# Patient Record
Sex: Male | Born: 1965 | Race: Black or African American | Hispanic: No | Smoking: Never smoker
Health system: Southern US, Community
[De-identification: ages and names within clinical notes are randomized; demographics above are authoritative.]

## PROBLEM LIST (undated history)

## (undated) DIAGNOSIS — G4733 Obstructive sleep apnea (adult) (pediatric): Secondary | ICD-10-CM

## (undated) DIAGNOSIS — I10 Essential (primary) hypertension: Secondary | ICD-10-CM

## (undated) HISTORY — PX: KNEE ARTHROSCOPY: SUR90

## (undated) HISTORY — DX: Obstructive sleep apnea (adult) (pediatric): G47.33

## (undated) HISTORY — PX: KELOID EXCISION: SHX1856

## (undated) HISTORY — DX: Essential (primary) hypertension: I10

---

## 2008-12-31 ENCOUNTER — Emergency Department (HOSPITAL_COMMUNITY): Admission: EM | Admit: 2008-12-31 | Discharge: 2008-12-31 | Payer: Self-pay | Admitting: Emergency Medicine

## 2010-06-28 IMAGING — CT CT HEAD W/O CM
1 of 2 series · 16 of 30 positions shown, 20 images · non-contrast
Comparison: None

CLINICAL DATA: Seizure

CT HEAD WITHOUT CONTRAST
TECHNIQUE: Contiguous axial images were obtained from the base of
the skull through the vertex without contrast.

[Series 3: recon 2: brain · axial · 0.47mm/px · z∈[+159,+287]mm · 16 of 56 slices shown, 20 images]
[im 3/56  brain]
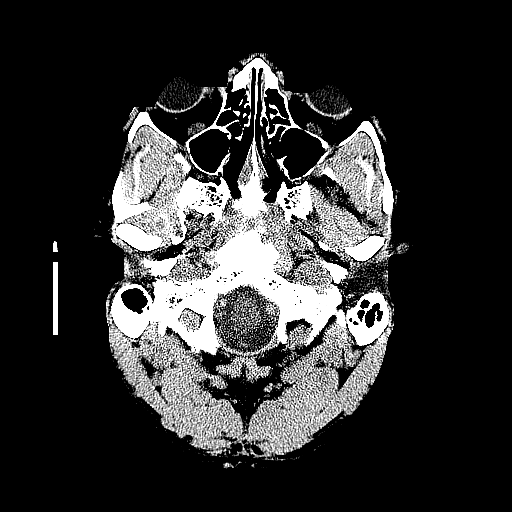
[im 3/56  bone]
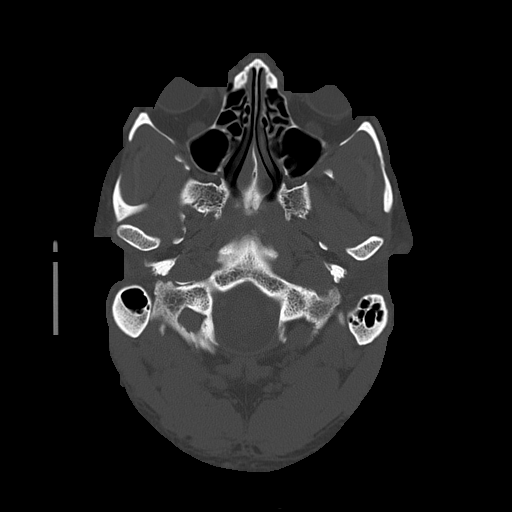
[im 6/56  brain]
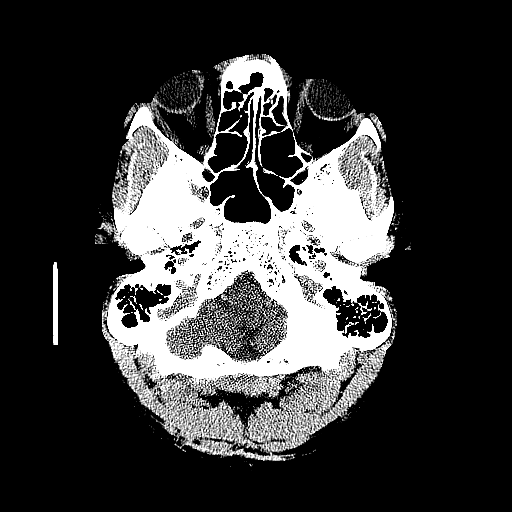
[im 9/56  brain]
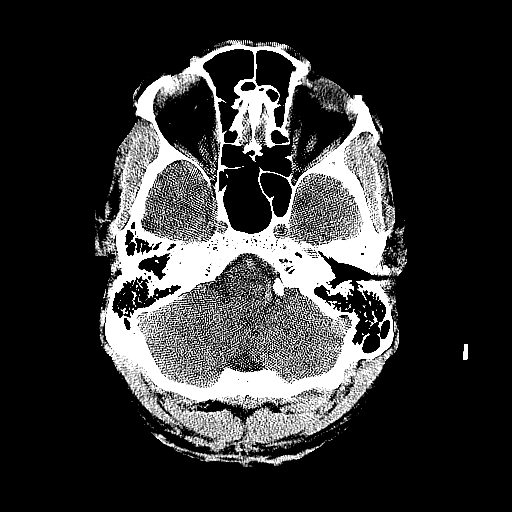
[im 12/56  brain]
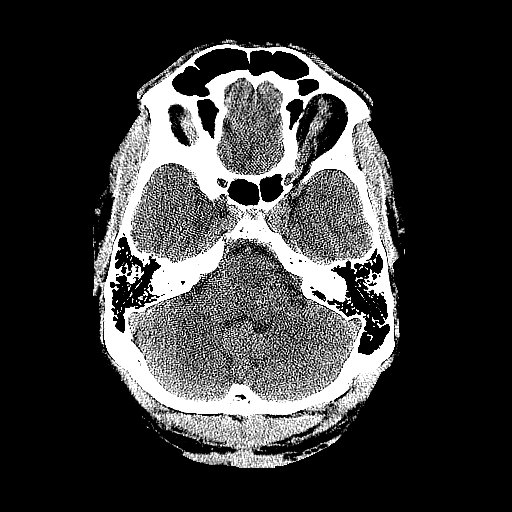
[im 18/56  brain]
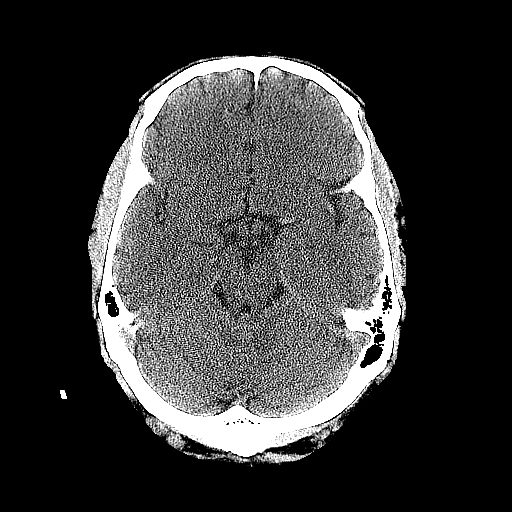
[im 18/56  bone]
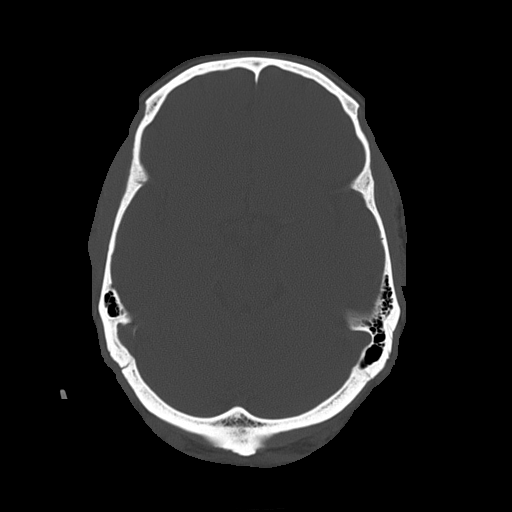
[im 21/56  brain]
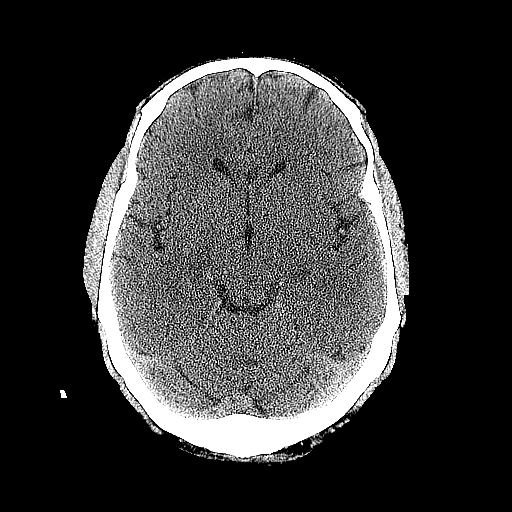
[im 24/56  brain]
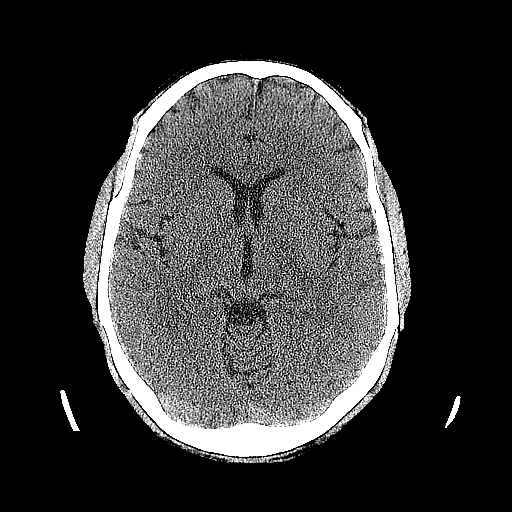
[im 27/56  brain]
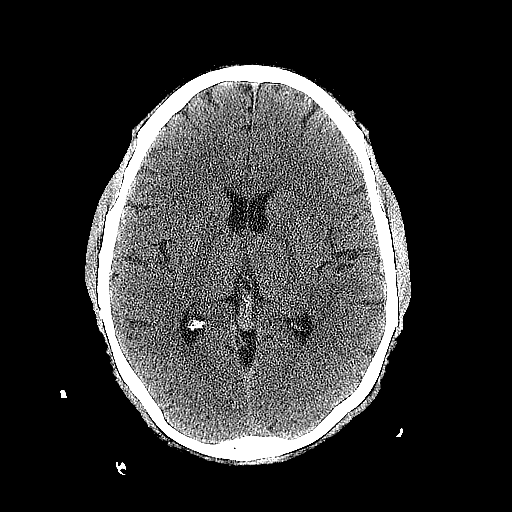
[im 29/56  brain]
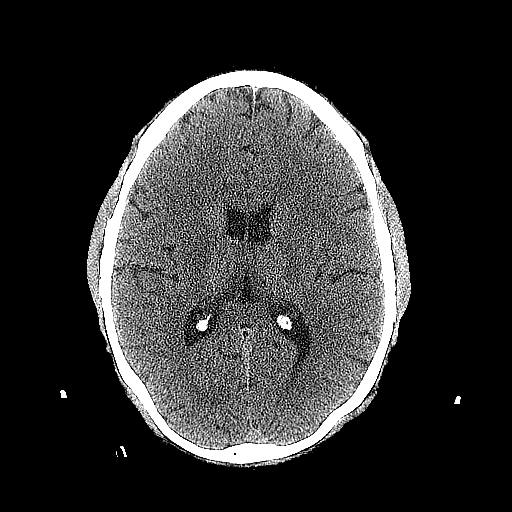
[im 29/56  bone]
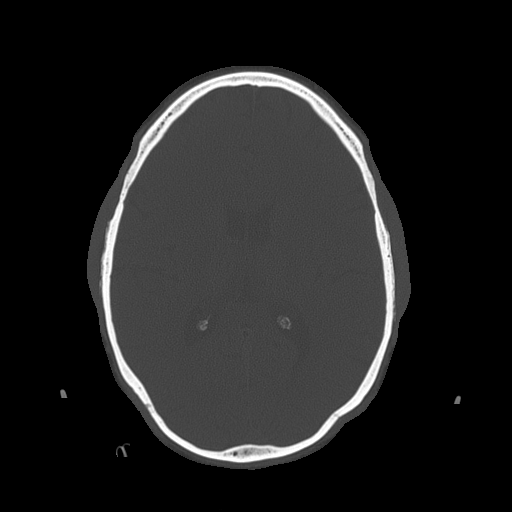
[im 32/56  brain]
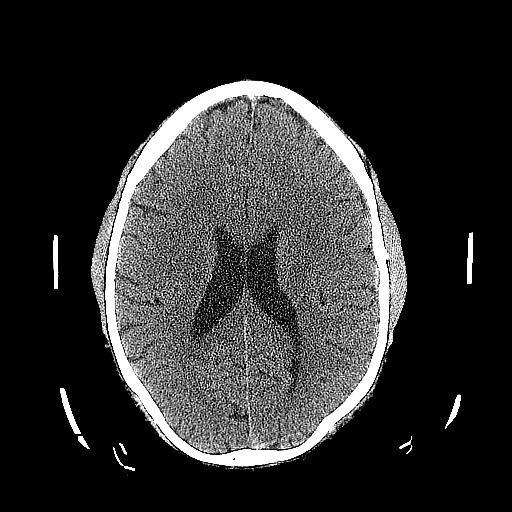
[im 35/56  brain]
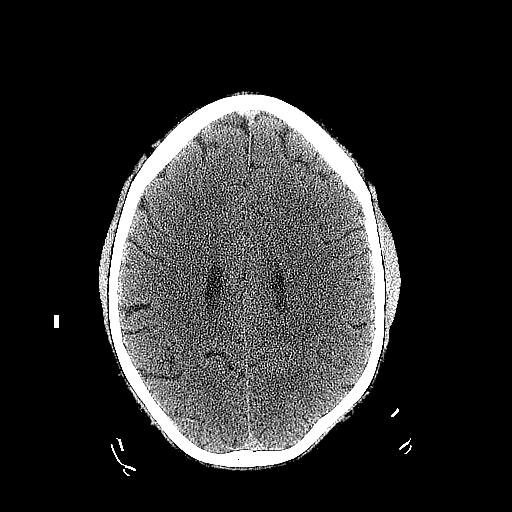
[im 38/56  brain]
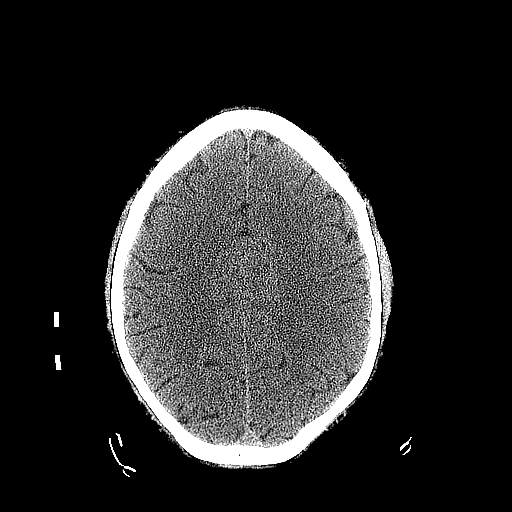
[im 44/56  brain]
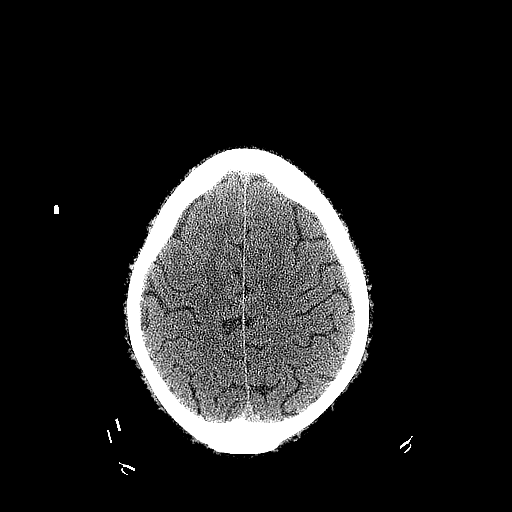
[im 44/56  bone]
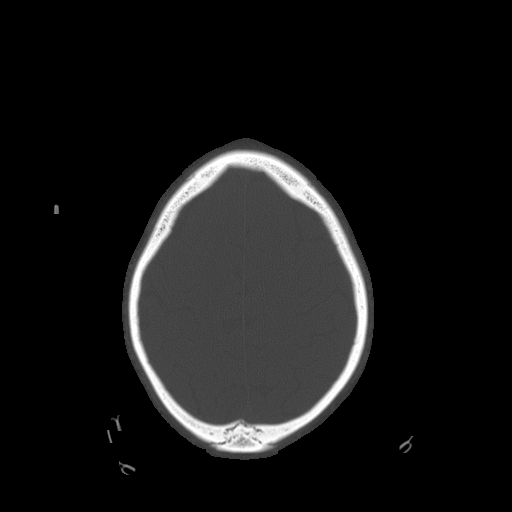
[im 47/56  brain]
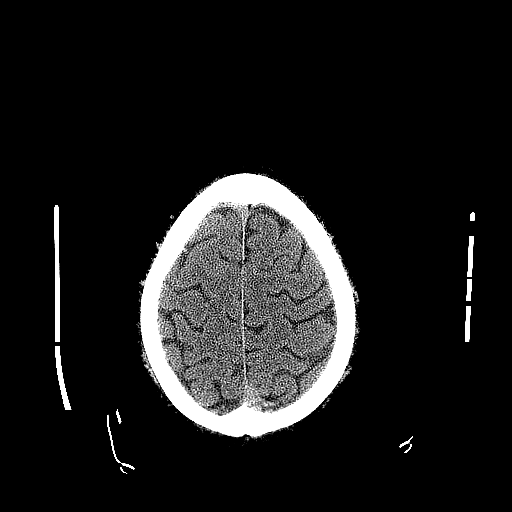
[im 50/56  brain]
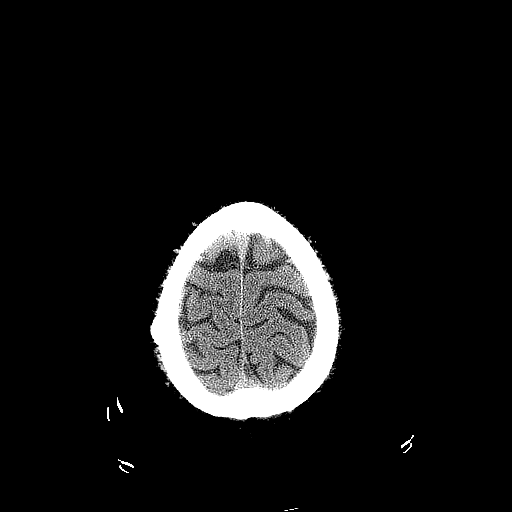
[im 53/56  brain]
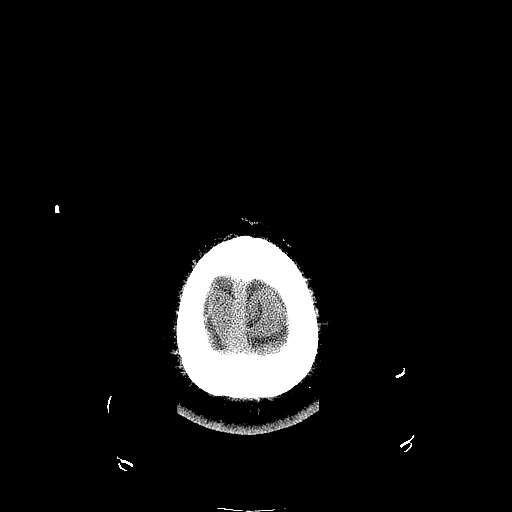

[16 of 30 positions shown; findings below may reference images not displayed]

FINDINGS: There is no evidence of acute intracranial hemorrhage,
brain edema, mass lesion, acute infarction,   mass effect, or
midline shift. Acute infarct may be inapparent on noncontrast CT.
No other intra-axial abnormalities are seen, and the ventricles and
sulci are within normal limits in size and symmetry.   No abnormal
extra-axial fluid collections or masses are identified.  No
significant calvarial abnormality.
IMPRESSION: Negative for bleed or other acute intracranial process.

## 2010-09-01 LAB — COMPREHENSIVE METABOLIC PANEL
ALT: 26 U/L (ref 0–53)
Alkaline Phosphatase: 54 U/L (ref 39–117)
BUN: 12 mg/dL (ref 6–23)
CO2: 26 mEq/L (ref 19–32)
GFR calc non Af Amer: 57 mL/min — ABNORMAL LOW (ref >60)
Glucose, Bld: 89 mg/dL (ref 70–99)
Potassium: 4 mEq/L (ref 3.5–5.1)
Sodium: 135 mEq/L (ref 135–145)

## 2010-09-01 LAB — RAPID URINE DRUG SCREEN, HOSP PERFORMED
Amphetamines: NOT DETECTED
Opiates: NOT DETECTED
Tetrahydrocannabinol: NOT DETECTED

## 2010-09-01 LAB — CBC
HCT: 45.7 % (ref 39.0–52.0)
Hemoglobin: 15.5 g/dL (ref 13.0–17.0)
MCHC: 33.8 g/dL (ref 30.0–36.0)
RBC: 5.31 MIL/uL (ref 4.22–5.81)

## 2010-09-01 LAB — URINE MICROSCOPIC-ADD ON

## 2010-09-01 LAB — URINALYSIS, ROUTINE W REFLEX MICROSCOPIC
Leukocytes, UA: NEGATIVE
Nitrite: NEGATIVE
Protein, ur: NEGATIVE mg/dL
Urobilinogen, UA: 0.2 mg/dL (ref 0.0–1.0)

## 2010-09-01 LAB — DIFFERENTIAL
Basophils Absolute: 0 10*3/uL (ref 0.0–0.1)
Basophils Relative: 0 % (ref 0–1)
Eosinophils Absolute: 0.2 10*3/uL (ref 0.0–0.7)
Neutrophils Relative %: 80 % — ABNORMAL HIGH (ref 43–77)

## 2016-08-20 ENCOUNTER — Encounter: Payer: Self-pay | Admitting: Neurology

## 2016-08-20 ENCOUNTER — Ambulatory Visit (INDEPENDENT_AMBULATORY_CARE_PROVIDER_SITE_OTHER): Payer: BC Managed Care – PPO | Admitting: Neurology

## 2016-08-20 VITALS — BP 140/88 | HR 76 | Ht 71.0 in | Wt 249.0 lb

## 2016-08-20 DIAGNOSIS — R569 Unspecified convulsions: Secondary | ICD-10-CM

## 2016-08-20 DIAGNOSIS — G4733 Obstructive sleep apnea (adult) (pediatric): Secondary | ICD-10-CM

## 2016-08-20 MED ORDER — LEVETIRACETAM 500 MG PO TABS: 500.0000 mg | ORAL_TABLET | Freq: Two times a day (BID) | ORAL | 6 refills | Status: AC

## 2016-08-20 NOTE — Patient Instructions (Signed)
Remember to drink plenty of fluid, eat healthy meals and do not skip any meals. Try to eat protein with a every meal and eat a healthy snack such as fruit or nuts in between meals. Try to keep a regular sleep-wake schedule and try to exercise daily, particularly in the form of walking, 20-30 minutes a day, if you can.   As far as your medications are concerned, I would like to suggest  As far as diagnostic testing: EEG, MRI brain, labs today,  then a home EEG  I would like to see you back in 3 months, sooner if we need to. Please call us with any interim questions, concerns, problems, updates or refill requests.   Our phone number is [phone]. We also have an after hours call service for urgent matters and there is a physician on-call for urgent questions. For any emergencies you know to call 911 or go to the nearest emergency room  Levetiracetam tablets What is this medicine? LEVETIRACETAM (lee ve tye RA se tam) is an antiepileptic drug. It is used with other medicines to treat certain types of seizures. This medicine may be used for other purposes; ask your health care provider or pharmacist if you have questions. COMMON BRAND NAME(S): Keppra, Roweepra What should I tell my health care provider before I take this medicine? They need to know if you have any of these conditions: -kidney disease -suicidal thoughts, plans, or attempt; a previous suicide attempt by you or a family member -an unusual or allergic reaction to levetiracetam, other medicines, foods, dyes, or preservatives -pregnant or trying to get pregnant -breast-feeding How should I use this medicine? Take this medicine by mouth with a glass of water. Follow the directions on the prescription label. Swallow the tablets whole. Do not crush or chew this medicine. You may take this medicine with or without food. Take your doses at regular intervals. Do not take your medicine more often than directed. Do not stop taking this  medicine or any of your seizure medicines unless instructed by your doctor or health care professional. Stopping your medicine suddenly can increase your seizures or their severity. A special MedGuide will be given to you by the pharmacist with each prescription and refill. Be sure to read this information carefully each time. Contact your pediatrician or health care professional regarding the use of this medication in children. While this drug may be prescribed for children as young as 38 years of age for selected conditions, precautions do apply. Overdosage: If you think you have taken too much of this medicine contact a poison control center or emergency room at once. NOTE: This medicine is only for you. Do not share this medicine with others. What if I miss a dose? If you miss a dose, take it as soon as you can. If it is almost time for your next dose, take only that dose. Do not take double or extra doses. What may interact with this medicine? This medicine may interact with the following medications: -carbamazepine -colesevelam -probenecid -sevelamer This list may not describe all possible interactions. Give your health care provider a list of all the medicines, herbs, non-prescription drugs, or dietary supplements you use. Also tell them if you smoke, drink alcohol, or use illegal drugs. Some items may interact with your medicine. What should I watch for while using this medicine? Visit your doctor or health care professional for a regular check on your progress. Wear a medical identification bracelet or chain to say you  have epilepsy, and carry a card that lists all your medications. It is important to take this medicine exactly as instructed by your health care professional. When first starting treatment, your dose may need to be adjusted. It may take weeks or months before your dose is stable. You should contact your doctor or health care professional if your seizures get worse or if you  have any new types of seizures. You may get drowsy or dizzy. Do not drive, use machinery, or do anything that needs mental alertness until you know how this medicine affects you. Do not stand or sit up quickly, especially if you are an older patient. This reduces the risk of dizzy or fainting spells. Alcohol may interfere with the effect of this medicine. Avoid alcoholic drinks. The use of this medicine may increase the chance of suicidal thoughts or actions. Pay special attention to how you are responding while on this medicine. Any worsening of mood, or thoughts of suicide or dying should be reported to your health care professional right away. Women who become pregnant while using this medicine may enroll in the [place] Antiepileptic Drug Pregnancy Registry by calling [phone]. This registry collects information about the safety of antiepileptic drug use during pregnancy. What side effects may I notice from receiving this medicine? Side effects that you should report to your doctor or health care professional as soon as possible: -allergic reactions like skin rash, itching or hives, swelling of the face, lips, or tongue -breathing problems -dark urine -general ill feeling or flu-like symptoms -problems with balance, talking, walking -unusually weak or tired -worsening of mood, thoughts or actions of suicide or dying -yellowing of the eyes or skin Side effects that usually do not require medical attention (report to your doctor or health care professional if they continue or are bothersome): -diarrhea -dizzy, drowsy -headache -loss of appetite This list may not describe all possible side effects. Call your doctor for medical advice about side effects. You may report side effects to FDA at 1-800-FDA-1088. Where should I keep my medicine? Keep out of reach of children. Store at room temperature between 15 and 30 degrees C (59 and 86 degrees F). Throw away any unused medicine after  the expiration date. NOTE: This sheet is a summary. It may not cover all possible information. If you have questions about this medicine, talk to your doctor, pharmacist, or health care provider.  2018 Elsevier/Gold Standard (2015-06-14 09:43:54)   Epilepsy Epilepsy is a condition in which a person has repeated seizures over time. A seizure is a sudden burst of abnormal electrical and chemical activity in the brain. Seizures can cause a change in attention, behavior, or the ability to remain awake and alert (altered mental status). Epilepsy increases a person's risk of falls, accidents, and injury. It can also lead to complications, including:  Depression.  Poor memory.  Sudden unexplained death in epilepsy (SUDEP). This complication is rare, and its cause is not known. Most people with epilepsy lead normal lives. What are the causes? This condition may be caused by:  A head injury.  An injury that happens at birth.  A high fever during childhood.  A stroke.  Bleeding that goes into or around the brain.  Certain medicines and drugs.  Having too little oxygen for a long period of time.  Abnormal brain development.  Certain infections, such as meningitis and encephalitis.  Brain tumors.  Conditions that are passed along from parent to child (are hereditary). What are the  signs or symptoms? Symptoms of a seizure vary greatly from person to person. They include:  Convulsions.  Stiffening of the body.  Involuntary movements of the arms or legs.  Loss of consciousness.  Breathing problems.  Falling suddenly.  Confusion.  Head nodding.  Eye blinking or fluttering.  Lip smacking.  Drooling.  Rapid eye movements.  Grunting.  Loss of bladder control and bowel control.  Staring.  Unresponsiveness. Some people have symptoms right before a seizure happens (aura) and right after a seizure happens. Symptoms of an aura include:  Fear or  anxiety.  Nausea.  Feeling like the room is spinning (vertigo).  A feeling of having seen or heard something before (deja vu).  Odd tastes or smells.  Changes in vision, such as seeing flashing lights or spots. Symptoms that follow a seizure include:  Confusion.  Sleepiness.  Headache. How is this diagnosed? This condition is diagnosed based on:  Your symptoms.  Your medical history.  A physical exam.  A neurological exam. A neurological exam is similar to a physical exam. It involves checking your strength, reflexes, coordination, and sensations.  Tests, such as:  An electroencephalogram (EEG). This is a painless test that creates a diagram of your brain waves.  An MRI of the brain.  A CT scan of the brain.  A lumbar puncture, also called a spinal tap.  Blood tests to check for signs of infection or abnormal blood chemistry. How is this treated? There is no cure for this condition, but treatment can help control seizures. Treatment may involve:  Taking medicines to control seizures. These include medicines to prevent seizures and medicines to stop seizures as they occur.  Having a device called a vagus nerve stimulator implanted in the chest. The device sends electrical impulses to the vagus nerve and to the brain to prevent seizures. This treatment may be recommended if medicines do not help.  Brain surgery. There are several kinds of surgeries that may be done to stop seizures from happening or to reduce how often seizures happen.  Having regular blood tests. You may need to have blood tests regularly to check that you are getting the right amount of medicine. Once this condition has been diagnosed, it is important to begin treatment as soon as possible. For some people, epilepsy eventually goes away. Follow these instructions at home: Medicines    Take over-the-counter and prescription medicines only as told by your health care provider.  Avoid any  substances that may prevent your medicine from working properly, such as alcohol. Activity   Get enough rest. Lack of sleep can make seizures more likely to occur.  Follow instructions from your health care provider about driving, swimming, and doing any other activities that would be dangerous if you had a seizure. Educating others  Teach friends and family what to do if you have a seizure. They should:  Lay you on the ground to prevent a fall.  Cushion your head and body.  Loosen any tight clothing around your neck.  Turn you on your side. If vomiting occurs, this helps keep your airway clear.  Stay with you until you recover.  Not hold you down. Holding you down will not stop the seizure.  Not put anything in your mouth.  Know whether or not you need emergency care. General instructions   Avoid anything that has ever triggered a seizure for you.  Keep a seizure diary. Record what you remember about each seizure, especially anything that might  have triggered the seizure.  Keep all follow-up visits as told by your health care provider. This is important. Contact a health care provider if:  Your seizure pattern changes.  You have symptoms of infection or another illness. This might increase your risk of having a seizure. Get help right away if:  You have a seizure that does not stop after 5 minutes.  You have several seizures in a row without a complete recovery in between seizures.  You have a seizure that makes it harder to breathe.  You have a seizure that is different from previous seizures.  You have a seizure that leaves you unable to speak or use a part of your body.  You did not wake up immediately after a seizure. This information is not intended to replace advice given to you by your health care provider. Make sure you discuss any questions you have with your health care provider. Document Released: 05/12/2005 Document Revised: 12/08/2015 Document Reviewed:  11/20/2015 Elsevier Interactive Patient Education  2017 [employer].

## 2016-08-20 NOTE — Procedures (Signed)
    History:  Kurt Pearson is a 51 year old gentleman with a seizure-like episodes that have occurred out of sleep, 2 such events have been witnessed associated with tongue biting. The first episode occurred in 2009. The patient has untreated sleep apnea. He is being evaluated for the witnessed seizures.  This is a prolonged EEG. No skull defects are noted. Medications include allopurinol, Lotrel, and Keppra.   EEG classification: Normal awake and asleep  Description of the recording: The background rhythms of this recording consists of a fairly well modulated medium amplitude background activity of 9 Hz. As the record progresses, the patient initially is in the waking state, but appears to enter the early stage II sleep during the recording, with rudimentary sleep spindles and vertex sharp wave activity seen. During the wakeful state, photic stimulation is performed, and this results in a bilateral and symmetric photic driving response. Hyperventilation was also performed, and this results in a minimal buildup of the background rhythm activities without significant slowing seen. At no time during the recording does there appear to be evidence of spike or spike wave discharges or evidence of focal slowing. EKG monitor shows no evidence of cardiac rhythm abnormalities with a heart rate of 54.  Impression: This is a normal EEG recording in the waking and sleeping state. No evidence of ictal or interictal discharges were seen at any time during the recording.

## 2016-08-20 NOTE — Addendum Note (Signed)
Addended by: Naomie DeanAHERN, ANTONIA B on: 08/20/2016 09:12 AM   Modules accepted: Orders

## 2016-08-20 NOTE — Progress Notes (Signed)
[facility]    Provider:  Dr Lucia GaskinsAhern Referring Provider: Roma KayserSkillman, Katie, PA-C, Donzetta SprungERRY DANIEL, MD, Primary Care Physician:  Donzetta SprungERRY DANIEL, MD, Roma KayserSkillman, Katie, [place]  CC:  Seizures  HPI:  Kurt Pearson is a 51 y.o. male here as a referral from Dr. Vernice JeffersonSkillman for Seizures. PMHx HTN. The seizures always occur at night, 2 have been witnessed. He has bitten his tongue. First episode in 2009. Happened during sleep. When he "came to" he was in [place]. Witnessed. Body convulsions, couldn't talk, couldn't remember it. He has bitten his tongue in the past as well. He has had up to 5 since 2009. 2-3 weeks was the last one, no known triggers, no inciting events, no known FHx. He woke up feeling bad, not energized, she he slept the entire day which is consistent with previous episodes. No seizures as a child. [place] went [place] after the event in the [place]. He also has untreated sleep apnea, will repeat sleep study (last 6 years ago and could not tolerate the cpap). No other focal neurologic deficits, associated symptoms, inciting events or modifiable factors.  CT showed No acute intracranial abnormalities including mass lesion or mass effect, hydrocephalus, extra-axial fluid collection, midline shift, hemorrhage, or acute infarction, large ischemic events (personally reviewed images)  Review of Systems: Patient complains of symptoms per HPI as well as the following symptoms: numbness, seizure, decreased energy, disinterest in activities. Pertinent negatives per HPI. All others negative.   Social History   Social History  . Marital status: Legally Separated    Spouse name: N/A  . Number of children: 2  . Years of education: Masters   Occupational History  . 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 Smoking status: Never Smoker  . Smokeless tobacco: Never Used  . Alcohol use Yes     Comment: Rare use  . Drug use: No  . Sexual activity: Not on file    Other Topics Concern  . Not on file   Social History Narrative   Lives at home alone.   Right-handed.   1 cup caffeine daily.    Family History  Problem Relation Age of Onset  . Hypertension Mother   . Prostate cancer Father   . Seizures Neg Hx     Past Medical History:  Diagnosis Date  . Hypertension   . OSA (obstructive sleep apnea)     Past Surgical History:  Procedure Laterality Date  . KELOID EXCISION    . KNEE ARTHROSCOPY Right     Current Outpatient Prescriptions  Medication Sig Dispense Refill  . allopurinol (ZYLOPRIM) 300 MG tablet daily.    Marland Kitchen. amLODipine-benazepril (LOTREL) 10-40 MG capsule Take 1 capsule by mouth daily.    Marland Kitchen. levETIRAcetam (KEPPRA) 500 MG tablet Take 1 tablet (500 mg total) by mouth every 12 (twelve) hours. 60 tablet 6   No current facility-administered medications for this visit.     Allergies as of 08/20/2016  . (No Known Allergies)    Vitals: BP 140/88   Pulse 76   Ht 5\' 11"  (1.803 m)   Wt 249 lb (112.9 kg)   BMI 34.73 kg/m  Last Weight:  Wt Readings from Last 1 Encounters:  08/20/16 249 lb (112.9 kg)   Last Height:   Ht Readings from Last 1 Encounters:  08/20/16 5\' 11"  (1.803 m)   Physical exam: Exam: Gen: NAD, conversant, well nourised, obese, well groomed  CV: RRR, no MRG. No Carotid Bruits. No peripheral edema, warm, nontender Eyes: Conjunctivae clear without exudates or hemorrhage  Neuro: Detailed Neurologic Exam  Speech:    Speech is normal; fluent and spontaneous with normal comprehension.  Cognition:    The patient is oriented to person, place, and time;     recent and remote memory intact;     language fluent;     normal attention, concentration,     fund of knowledge Cranial Nerves:    The pupils are equal, round, and reactive to light. The fundi are normal and spontaneous venous pulsations are present. Visual fields are full to finger confrontation. Extraocular movements are  intact. Trigeminal sensation is intact and the muscles of mastication are normal. The face is symmetric. The palate elevates in the midline. Hearing intact. Voice is normal. Shoulder shrug is normal. The tongue has normal motion without fasciculations.   Coordination:    Normal finger to nose and heel to shin. Normal rapid alternating movements.   Gait:    Heel-toe and tandem gait are normal.   Motor Observation:    No asymmetry, no atrophy, and no involuntary movements noted. Tone:    Normal muscle tone.    Posture:    Posture is normal. normal erect    Strength:    Strength is V/V in the upper and lower limbs.      Sensation: intact to LT     Reflex Exam:  DTR's:    Deep tendon reflexes in the upper and lower extremities are normal bilaterally.   Toes:    The toes are downgoing bilaterally.   Clonus:    Clonus is absent.    Assessment/Plan:  51 year old male with at least 5 nocturnal seizures over the last 7-8 years.   - start Keppra - Routine EEG today - If neg, ambulatory eeg at home especially to monitor while sleeping - Needs sleep eval, was diagnosed with sleep apnea years ago but could not tolerate the cpap. Need repeat especially in light of nocturnal seizures - Labs today  Patient is unable to drive, operate heavy machinery, perform activities at heights or participate in water activities until 6 months seizure free  Discussed Patients with epilepsy have a small risk of sudden unexpected death, a condition referred to as sudden unexpected death in epilepsy (SUDEP). SUDEP is defined specifically as the sudden, unexpected, witnessed or unwitnessed, nontraumatic and nondrowning death in patients with epilepsy with or without evidence for a seizure, and excluding documented status epilepticus, in which post mortem examination does not reveal a structural or toxicologic cause for death   Cc: Donzetta Sprung, MD, Roma Kayser, PA-C  Naomie Dean, MD  [facility] [address] [place], [place] [address]  Phone [phone] Fax [phone]

## 2016-08-21 ENCOUNTER — Telehealth: Payer: Self-pay

## 2016-08-21 LAB — CBC
HEMATOCRIT: 43.5 % (ref 37.5–51.0)
HEMOGLOBIN: 14.2 g/dL (ref 13.0–17.7)
MCH: 28.1 pg (ref 26.6–33.0)
MCHC: 32.6 g/dL (ref 31.5–35.7)
MCV: 86 fL (ref 79–97)
Platelets: 264 10*3/uL (ref 150–379)
RBC: 5.06 x10E6/uL (ref 4.14–5.80)
RDW: 15 % (ref 12.3–15.4)
WBC: 7.4 10*3/uL (ref 3.4–10.8)

## 2016-08-21 LAB — COMPREHENSIVE METABOLIC PANEL
A/G RATIO: 1.8 (ref 1.2–2.2)
ALK PHOS: 83 IU/L (ref 39–117)
ALT: 15 IU/L (ref 0–44)
AST: 17 IU/L (ref 0–40)
Albumin: 4.4 g/dL (ref 3.5–5.5)
BILIRUBIN TOTAL: 0.2 mg/dL (ref 0.0–1.2)
BUN/Creatinine Ratio: 10 (ref 9–20)
BUN: 12 mg/dL (ref 6–24)
CHLORIDE: 104 mmol/L (ref 96–106)
CO2: 25 mmol/L (ref 18–29)
Calcium: 9.5 mg/dL (ref 8.7–10.2)
Creatinine, Ser: 1.18 mg/dL (ref 0.76–1.27)
GFR calc non Af Amer: 72 mL/min/{1.73_m2} (ref >59)
GFR, EST AFRICAN AMERICAN: 83 mL/min/{1.73_m2} (ref >59)
GLUCOSE: 107 mg/dL — AB (ref 65–99)
Globulin, Total: 2.4 g/dL (ref 1.5–4.5)
POTASSIUM: 4.8 mmol/L (ref 3.5–5.2)
Sodium: 142 mmol/L (ref 134–144)
TOTAL PROTEIN: 6.8 g/dL (ref 6.0–8.5)

## 2016-08-21 NOTE — Telephone Encounter (Signed)
Called pt w/ EEG results and plans for ambulatory EEG. May call back w/ additional questions/concerns.

## 2016-08-21 NOTE — Telephone Encounter (Signed)
-----   Message from Anson FretAntonia B Ahern, MD sent at 08/21/2016  8:42 AM EDT ----- Labs unremarkable

## 2016-08-21 NOTE — Telephone Encounter (Signed)
Called pt w/ unremarkable lab results. Verbalized understanding and appreciation for call. 

## 2016-08-21 NOTE — Telephone Encounter (Signed)
-----   Message from Anson FretAntonia B Ahern, MD sent at 08/20/2016  4:46 PM EDT ----- EEG normal, will proceed to ambulatory/home eeg thanks

## 2016-09-03 ENCOUNTER — Ambulatory Visit (INDEPENDENT_AMBULATORY_CARE_PROVIDER_SITE_OTHER): Payer: BC Managed Care – PPO

## 2016-09-03 DIAGNOSIS — R569 Unspecified convulsions: Secondary | ICD-10-CM

## 2016-09-03 MED ORDER — GADOPENTETATE DIMEGLUMINE 469.01 MG/ML IV SOLN: 20.0000 mL | Freq: Once | INTRAVENOUS | Status: AC | PRN

## 2016-09-09 ENCOUNTER — Telehealth: Payer: Self-pay

## 2016-09-09 NOTE — Telephone Encounter (Signed)
-----   Message from Anson Fret, MD sent at 09/08/2016  1:37 PM EDT ----- The brain appears normal before and after contrast. There is right hemi-sphenoid sinus disease.   If symptomatic, consider referral to ENT

## 2016-09-09 NOTE — Telephone Encounter (Signed)
Called pt w/ normal MRI results. May call back if he'd like referral to ENT or w/ any additional questions/concerns.

## 2016-09-22 ENCOUNTER — Telehealth: Payer: Self-pay

## 2016-09-22 ENCOUNTER — Institutional Professional Consult (permissible substitution): Payer: BC Managed Care – PPO | Admitting: Neurology

## 2016-09-22 NOTE — Telephone Encounter (Signed)
Patient did not show to new sleep consult.  

## 2016-09-23 ENCOUNTER — Encounter: Payer: Self-pay | Admitting: Neurology

## 2016-11-20 ENCOUNTER — Ambulatory Visit: Payer: BC Managed Care – PPO | Admitting: Adult Health

## 2016-11-21 ENCOUNTER — Encounter: Payer: Self-pay | Admitting: Adult Health

## 2019-02-24 DEATH — deceased
